# Patient Record
Sex: Female | Born: 1935 | Race: White | Hispanic: No | Smoking: Never smoker
Health system: Southern US, Community
[De-identification: ages and names within clinical notes are randomized; demographics above are authoritative.]

## PROBLEM LIST (undated history)

## (undated) DIAGNOSIS — R0989 Other specified symptoms and signs involving the circulatory and respiratory systems: Secondary | ICD-10-CM

## (undated) DIAGNOSIS — M791 Myalgia, unspecified site: Secondary | ICD-10-CM

## (undated) DIAGNOSIS — M79669 Pain in unspecified lower leg: Secondary | ICD-10-CM

## (undated) DIAGNOSIS — R609 Edema, unspecified: Secondary | ICD-10-CM

## (undated) DIAGNOSIS — M109 Gout, unspecified: Secondary | ICD-10-CM

## (undated) HISTORY — DX: Pain in unspecified lower leg: M79.669

## (undated) HISTORY — DX: Edema, unspecified: R60.9

## (undated) HISTORY — DX: Other specified symptoms and signs involving the circulatory and respiratory systems: R09.89

## (undated) HISTORY — DX: Gout, unspecified: M10.9

## (undated) HISTORY — DX: Myalgia, unspecified site: M79.10

## (undated) HISTORY — PX: OTHER SURGICAL HISTORY: SHX169

---

## 2003-02-24 ENCOUNTER — Ambulatory Visit (HOSPITAL_COMMUNITY): Admission: RE | Admit: 2003-02-24 | Discharge: 2003-02-24 | Payer: Self-pay | Admitting: Neurosurgery

## 2003-03-10 ENCOUNTER — Encounter: Admission: RE | Admit: 2003-03-10 | Discharge: 2003-03-10 | Payer: Self-pay | Admitting: Neurosurgery

## 2003-03-24 ENCOUNTER — Encounter: Admission: RE | Admit: 2003-03-24 | Discharge: 2003-03-24 | Payer: Self-pay | Admitting: Neurosurgery

## 2003-05-25 ENCOUNTER — Inpatient Hospital Stay (HOSPITAL_COMMUNITY): Admission: RE | Admit: 2003-05-25 | Discharge: 2003-05-31 | Payer: Self-pay | Admitting: Neurosurgery

## 2005-07-15 ENCOUNTER — Inpatient Hospital Stay (HOSPITAL_COMMUNITY): Admission: RE | Admit: 2005-07-15 | Discharge: 2005-07-19 | Payer: Self-pay | Admitting: Orthopedic Surgery

## 2006-09-24 IMAGING — US US RENAL
1 series · 14 of 25 positions shown · non-contrast
Comparison: None.

CLINICAL DATA: Decreased urine output. 
RENAL/URINARY TRACT ULTRASOUND:
TECHNIQUE: Complete ultrasound examination of the urinary tract was performed including evaluation of the kidneys, renal collecting systems, and urinary bladder.

[Series 1: unknown · 0.24mm/px · 14 of 38 slices shown]
[im 1/38]
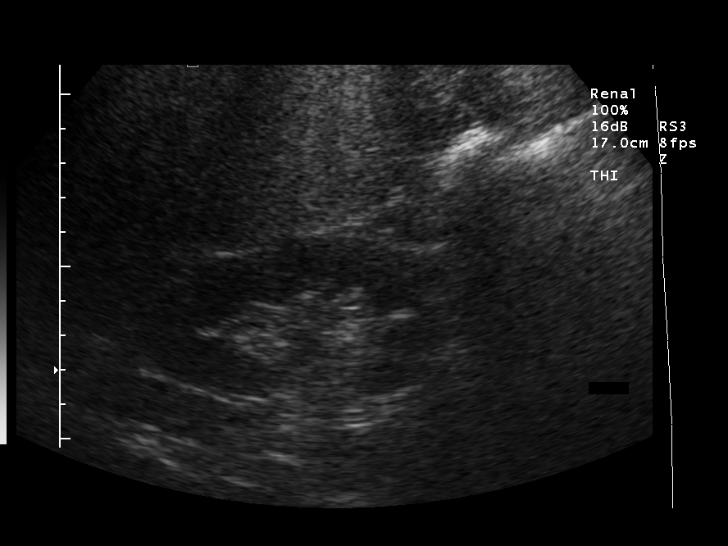
[im 4/38]
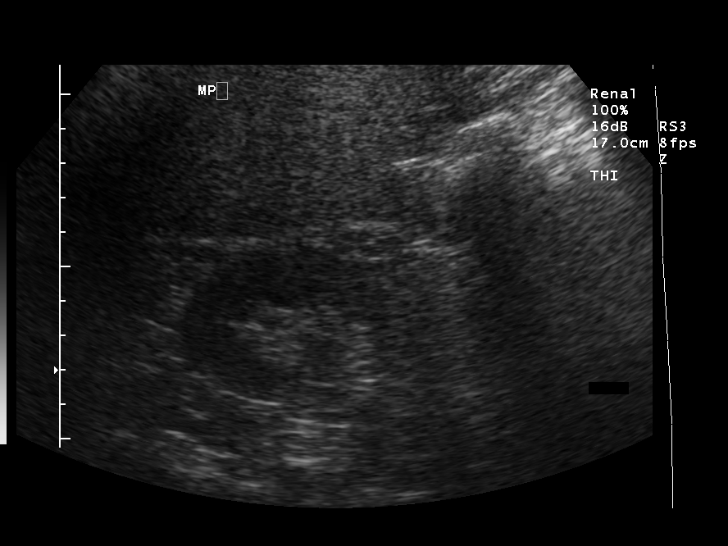
[im 7/38]
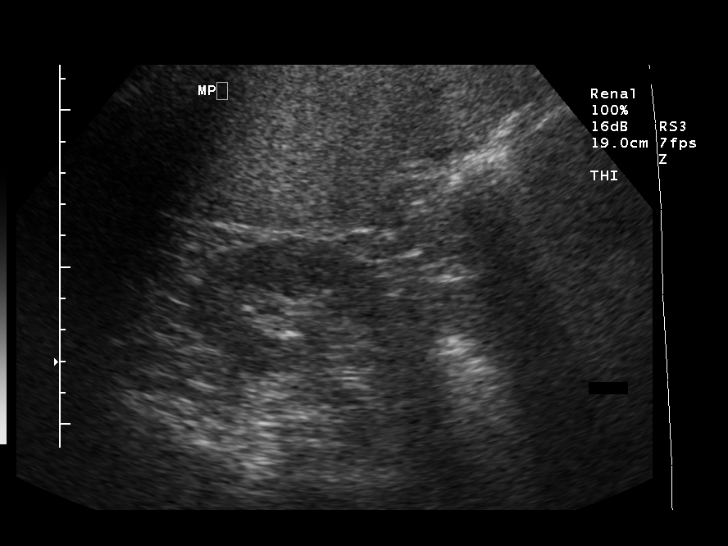
[im 10/38]
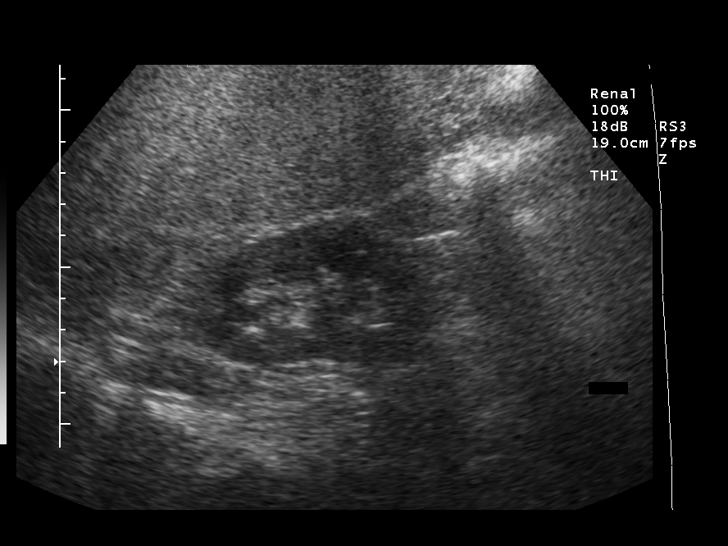
[im 13/38]
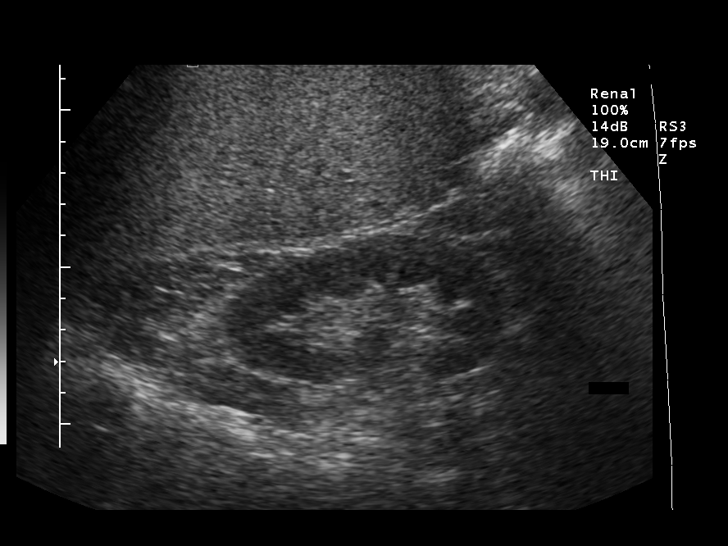
[im 14/38]
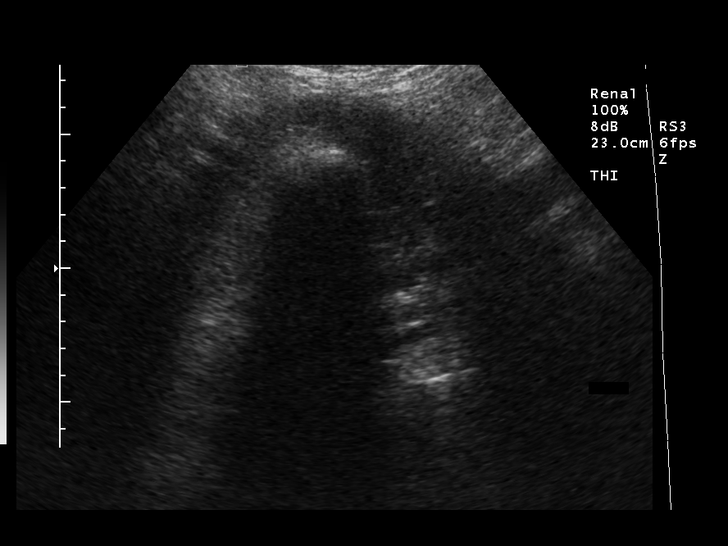
[im 17/38]
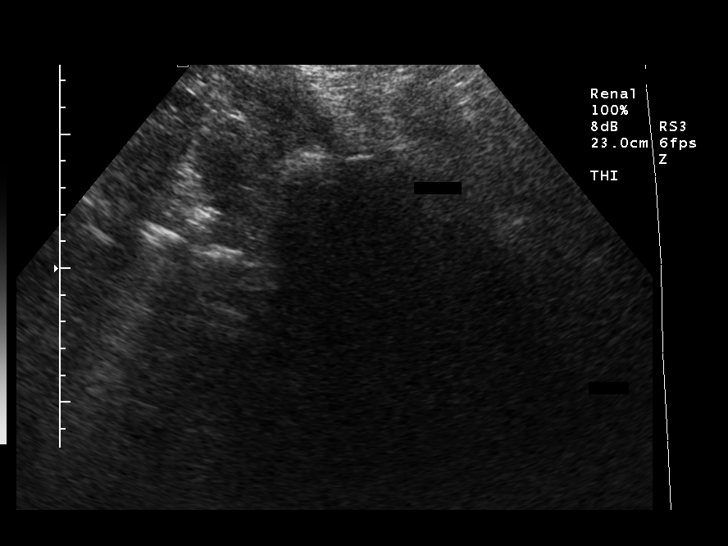
[im 21/38]
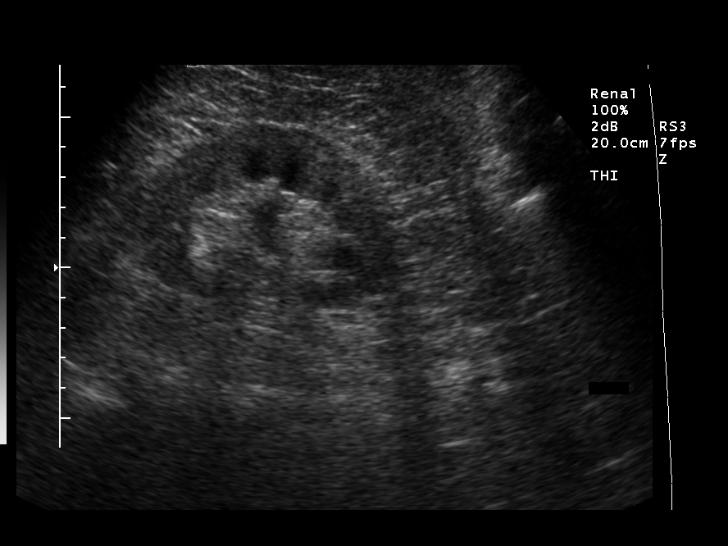
[im 24/38]
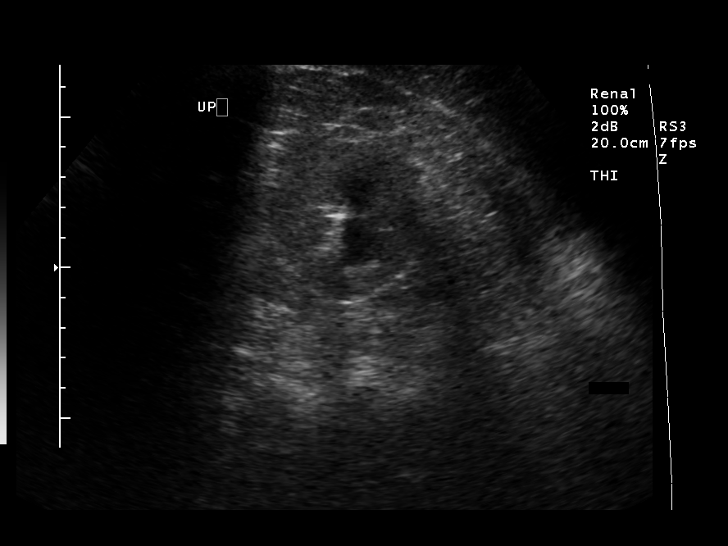
[im 25/38]
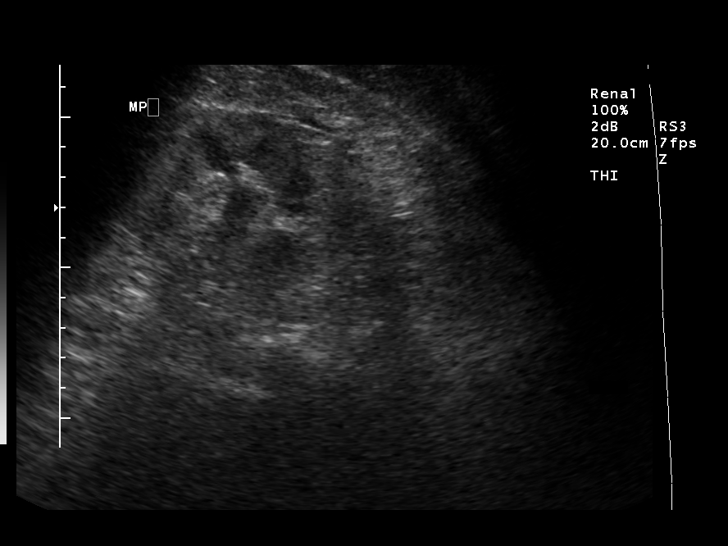
[im 28/38]
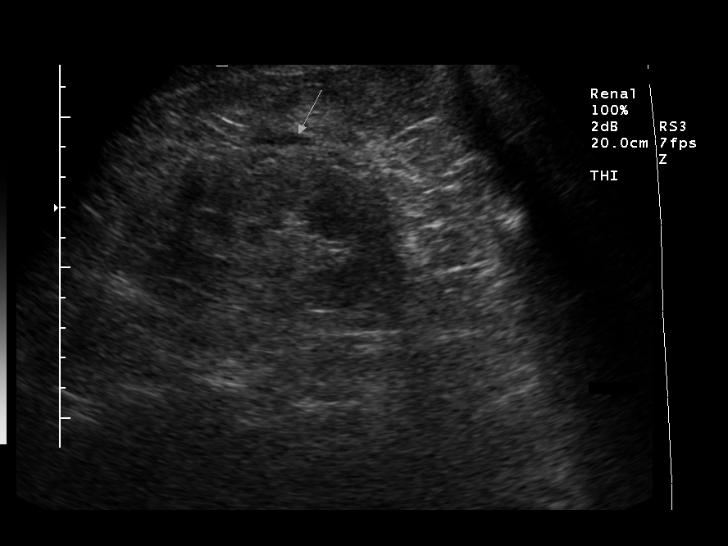
[im 31/38]
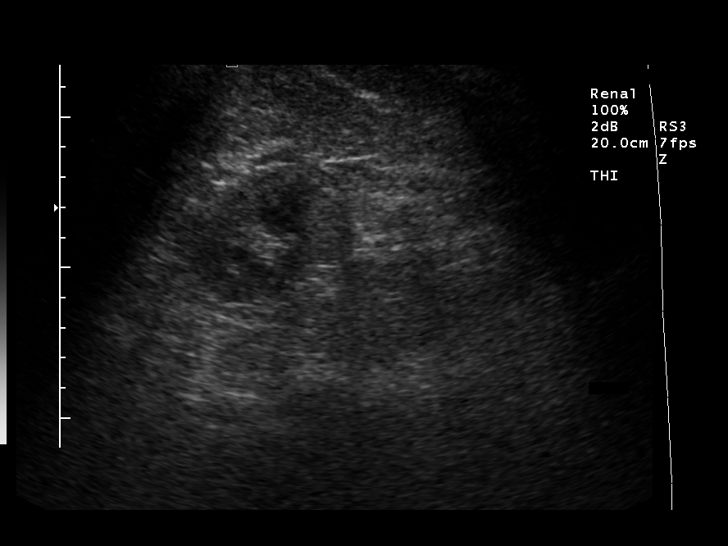
[im 34/38]
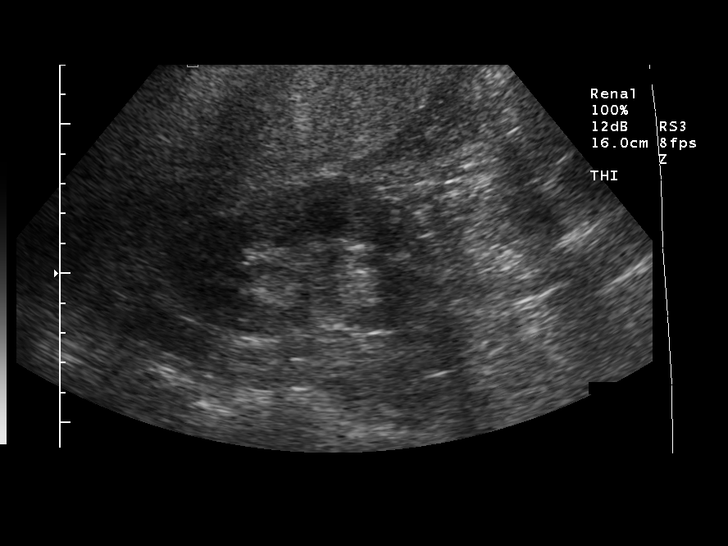
[im 38/38]
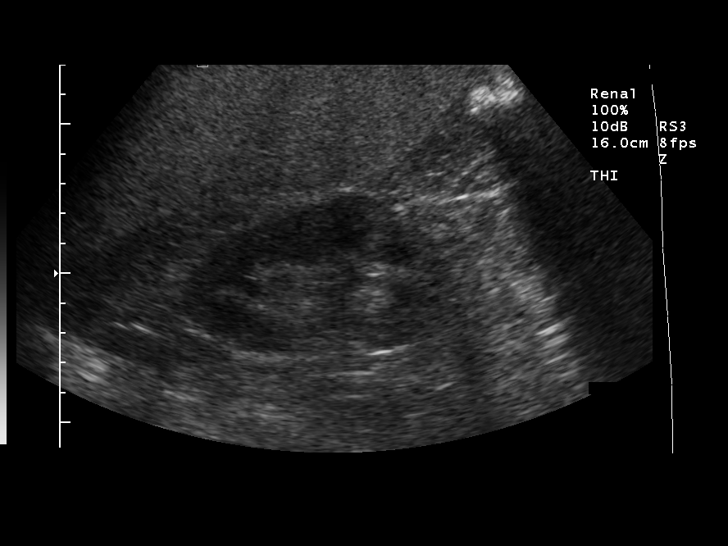

[14 of 25 positions shown; findings below may reference images not displayed]

FINDINGS: There is mild pelvocaliectasis, bilaterally.    The right kidney measures 10.1 and the left kidney measures 10.3 cm.
IMPRESSION: Mild pelvocaliectasis.

## 2008-09-12 ENCOUNTER — Inpatient Hospital Stay (HOSPITAL_COMMUNITY): Admission: RE | Admit: 2008-09-12 | Discharge: 2008-09-14 | Payer: Self-pay | Admitting: Orthopedic Surgery

## 2008-10-11 ENCOUNTER — Encounter: Admission: RE | Admit: 2008-10-11 | Discharge: 2008-11-03 | Payer: Self-pay | Admitting: Orthopedic Surgery

## 2009-11-21 IMAGING — CR DG HIP 1V PORT*R*
1 series · 1 of 1 positions shown · non-contrast
Comparison: None.

Addendum Begins

The incorrect laterality was dictated in both the Findings
paragraph and the Impression paragraph of the report.  This cross-
table lateral exam is of the patient's right hip not the left hip
as stated in the report below.  Otherwise, the report is accurate
as dictated, with no evidence for hardware complications in this
patient immediately status post right total hip replacement.
Addendum Ends
CLINICAL DATA: Postop for left hip replacement.
PORTABLE RIGHT HIP - 1 VIEW

[cross table lat hip]
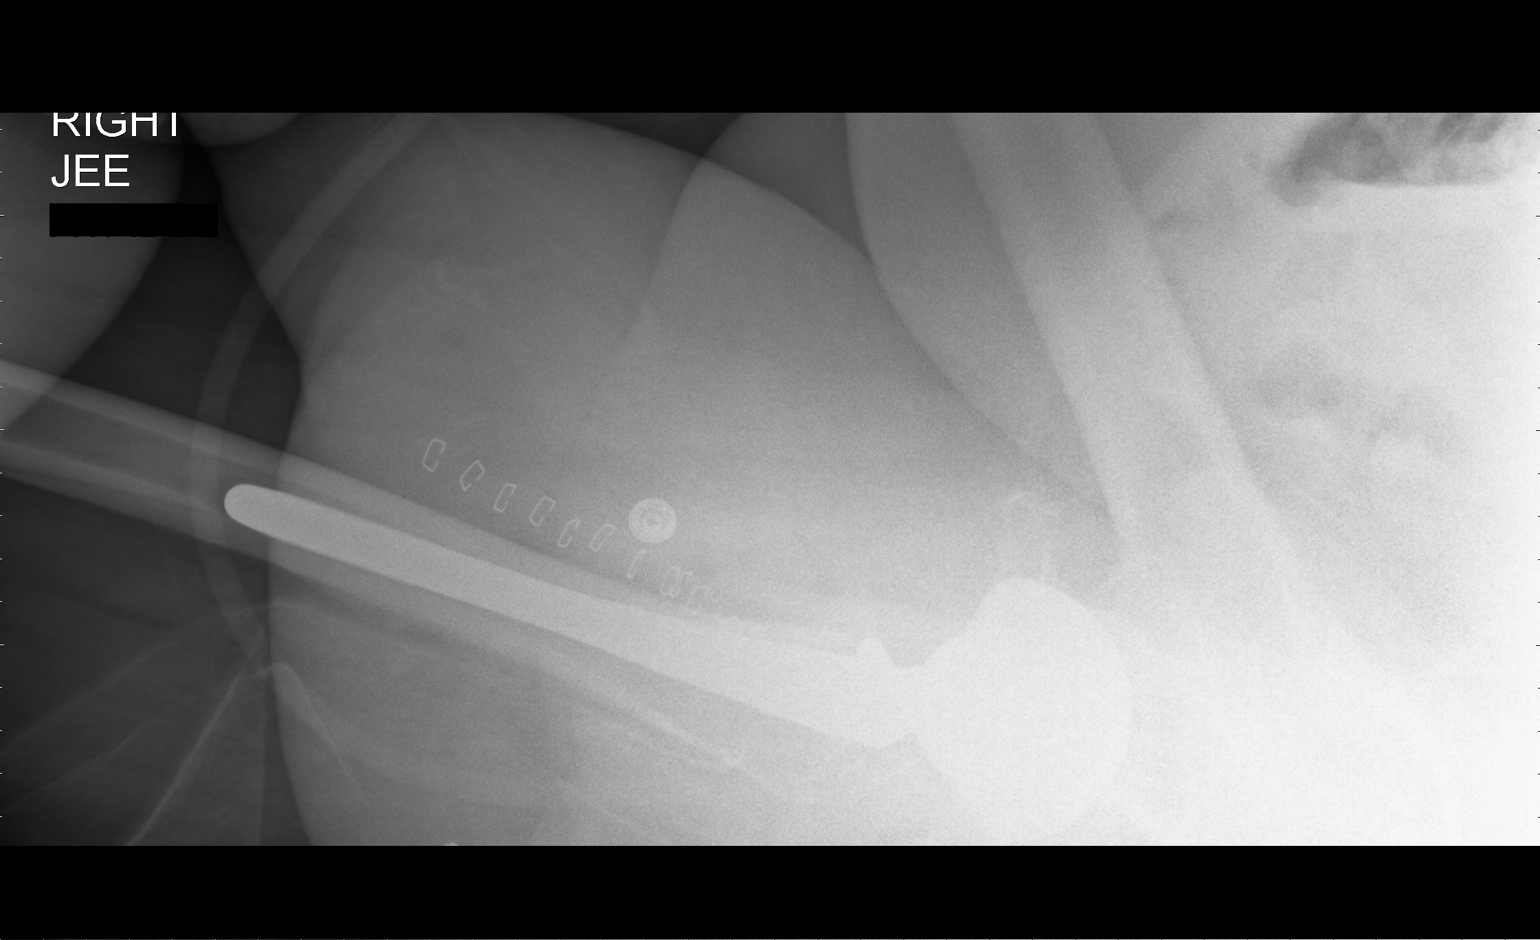

[1 of 1 positions shown; findings below may reference images not displayed]

FINDINGS: 6323 hours.  Cross-table lateral view of the left hip
shows the the patient to be status post a left total hip
replacement.  The femoral component appears located within the
acetabular cup.
IMPRESSION: Status post left total hip replacement.  No evidence for
complicating features.

## 2009-11-21 IMAGING — CR DG PORTABLE PELVIS
2 series · 2 of 2 positions shown · non-contrast
Comparison: None

CLINICAL DATA: Postop right hip arthroplasty for osteoarthritis.

PORTABLE PELVIS

[AP (1 of 2)]
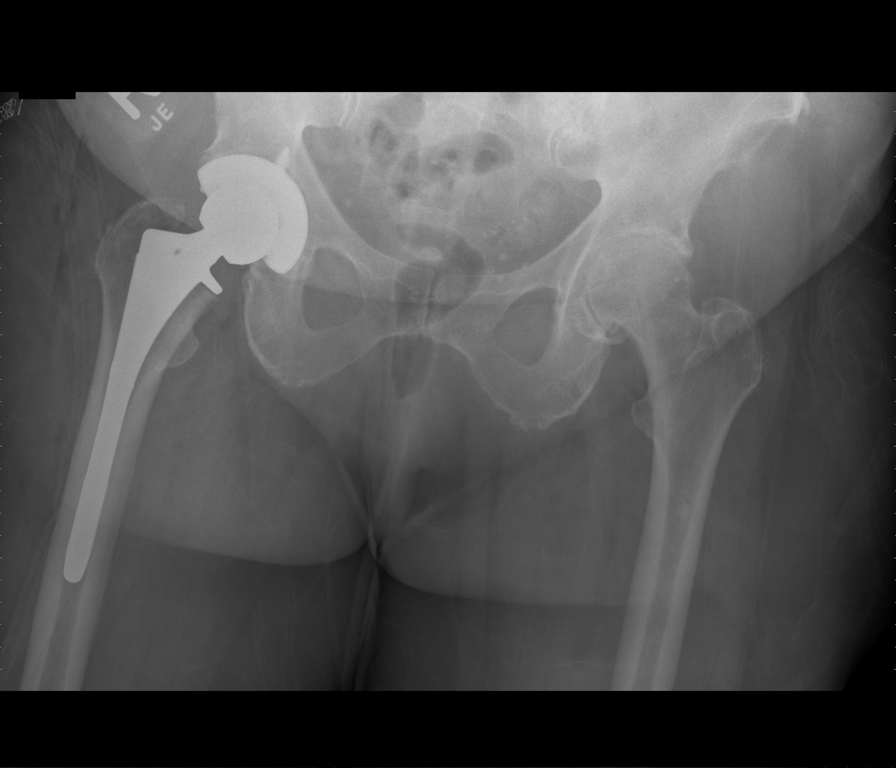

[AP (2 of 2)]
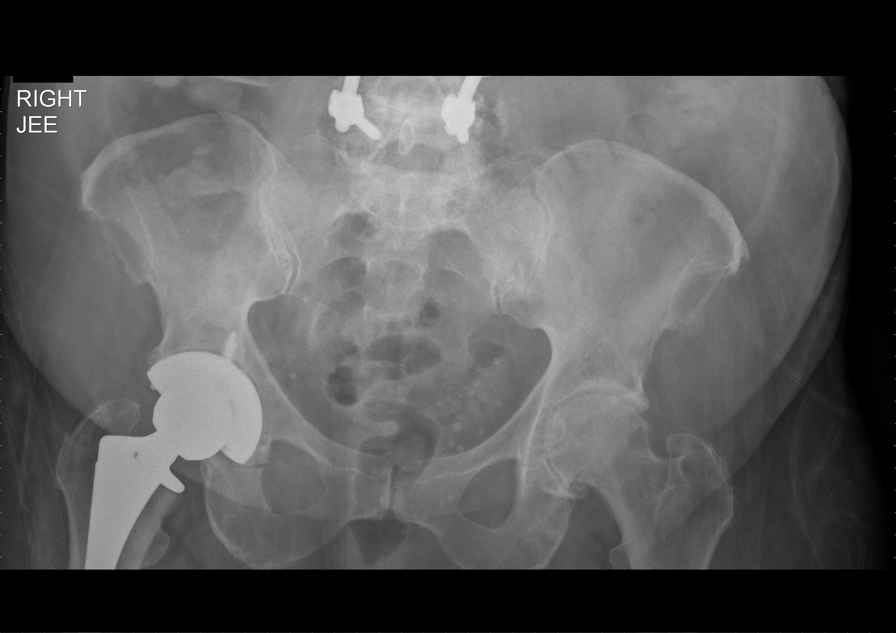

[2 of 2 positions shown; findings below may reference images not displayed]

FINDINGS: Two portable views demonstrate right total hip
replacement with a screw fixed acetabular component.  There is no
evidence of acute fracture or dislocation.  Left hip degenerative
changes and postsurgical changes of the lower lumbar spine are
noted.
IMPRESSION: No evidence of complication related to right total hip
arthroplasty.

## 2010-03-03 ENCOUNTER — Encounter: Payer: Self-pay | Admitting: Neurosurgery

## 2010-05-19 LAB — CBC
HCT: 23.1 % — ABNORMAL LOW (ref 36.0–46.0)
HCT: 26.4 % — ABNORMAL LOW (ref 36.0–46.0)
HCT: 28.6 % — ABNORMAL LOW (ref 36.0–46.0)
Hemoglobin: 8 g/dL — ABNORMAL LOW (ref 12.0–15.0)
Hemoglobin: 8.9 g/dL — ABNORMAL LOW (ref 12.0–15.0)
Hemoglobin: 9.9 g/dL — ABNORMAL LOW (ref 12.0–15.0)
MCHC: 33.9 g/dL (ref 30.0–36.0)
MCHC: 34.5 g/dL (ref 30.0–36.0)
MCHC: 34.7 g/dL (ref 30.0–36.0)
MCV: 88.4 fL (ref 78.0–100.0)
MCV: 88.5 fL (ref 78.0–100.0)
MCV: 89.7 fL (ref 78.0–100.0)
Platelets: 139 10*3/uL — ABNORMAL LOW (ref 150–400)
Platelets: 175 10*3/uL (ref 150–400)
Platelets: 205 10*3/uL (ref 150–400)
RBC: 2.61 MIL/uL — ABNORMAL LOW (ref 3.87–5.11)
RBC: 2.94 MIL/uL — ABNORMAL LOW (ref 3.87–5.11)
RBC: 3.23 MIL/uL — ABNORMAL LOW (ref 3.87–5.11)
RDW: 13.8 % (ref 11.5–15.5)
RDW: 14 % (ref 11.5–15.5)
RDW: 14.7 % (ref 11.5–15.5)
WBC: 11.4 10*3/uL — ABNORMAL HIGH (ref 4.0–10.5)
WBC: 13.1 10*3/uL — ABNORMAL HIGH (ref 4.0–10.5)
WBC: 13.1 10*3/uL — ABNORMAL HIGH (ref 4.0–10.5)

## 2010-05-19 LAB — BASIC METABOLIC PANEL
BUN: 41 mg/dL — ABNORMAL HIGH (ref 6–23)
BUN: 43 mg/dL — ABNORMAL HIGH (ref 6–23)
CO2: 25 mEq/L (ref 19–32)
CO2: 27 mEq/L (ref 19–32)
Calcium: 8.1 mg/dL — ABNORMAL LOW (ref 8.4–10.5)
Calcium: 8.1 mg/dL — ABNORMAL LOW (ref 8.4–10.5)
Chloride: 98 mEq/L (ref 96–112)
Chloride: 99 mEq/L (ref 96–112)
Creatinine, Ser: 2.18 mg/dL — ABNORMAL HIGH (ref 0.4–1.2)
Creatinine, Ser: 2.29 mg/dL — ABNORMAL HIGH (ref 0.4–1.2)
GFR calc Af Amer: 25 mL/min — ABNORMAL LOW (ref >60)
GFR calc Af Amer: 27 mL/min — ABNORMAL LOW (ref >60)
GFR calc non Af Amer: 21 mL/min — ABNORMAL LOW (ref >60)
GFR calc non Af Amer: 22 mL/min — ABNORMAL LOW (ref >60)
Glucose, Bld: 117 mg/dL — ABNORMAL HIGH (ref 70–99)
Glucose, Bld: 119 mg/dL — ABNORMAL HIGH (ref 70–99)
Potassium: 4.1 mEq/L (ref 3.5–5.1)
Potassium: 4.4 mEq/L (ref 3.5–5.1)
Sodium: 135 mEq/L (ref 135–145)
Sodium: 137 mEq/L (ref 135–145)

## 2010-05-19 LAB — URINE CULTURE
Colony Count: NO GROWTH
Culture: NO GROWTH

## 2010-05-19 LAB — URINALYSIS, ROUTINE W REFLEX MICROSCOPIC
Bilirubin Urine: NEGATIVE
Glucose, UA: NEGATIVE mg/dL
Hgb urine dipstick: NEGATIVE
Ketones, ur: NEGATIVE mg/dL
Nitrite: NEGATIVE
Protein, ur: NEGATIVE mg/dL
Specific Gravity, Urine: 1.021 (ref 1.005–1.030)
Urobilinogen, UA: 1 mg/dL (ref 0.0–1.0)
pH: 5.5 (ref 5.0–8.0)

## 2010-05-19 LAB — CROSSMATCH
ABO/RH(D): A POS
Antibody Screen: NEGATIVE

## 2010-05-19 LAB — ABO/RH: ABO/RH(D): A POS

## 2010-05-20 LAB — COMPREHENSIVE METABOLIC PANEL
ALT: 19 U/L (ref 0–35)
Alkaline Phosphatase: 158 U/L — ABNORMAL HIGH (ref 39–117)
BUN: 34 mg/dL — ABNORMAL HIGH (ref 6–23)
CO2: 31 mEq/L (ref 19–32)
Chloride: 102 mEq/L (ref 96–112)
Glucose, Bld: 83 mg/dL (ref 70–99)
Potassium: 5.3 mEq/L — ABNORMAL HIGH (ref 3.5–5.1)
Total Bilirubin: 0.6 mg/dL (ref 0.3–1.2)

## 2010-05-20 LAB — URINE MICROSCOPIC-ADD ON

## 2010-05-20 LAB — CBC
HCT: 33.6 % — ABNORMAL LOW (ref 36.0–46.0)
Hemoglobin: 11.5 g/dL — ABNORMAL LOW (ref 12.0–15.0)
MCHC: 34.3 g/dL (ref 30.0–36.0)
MCV: 89.4 fL (ref 78.0–100.0)
Platelets: 273 10*3/uL (ref 150–400)
RBC: 3.76 MIL/uL — ABNORMAL LOW (ref 3.87–5.11)
RDW: 13.5 % (ref 11.5–15.5)
WBC: 9 10*3/uL (ref 4.0–10.5)

## 2010-05-20 LAB — URINE CULTURE: Colony Count: 35000

## 2010-05-20 LAB — URINALYSIS, ROUTINE W REFLEX MICROSCOPIC
Bilirubin Urine: NEGATIVE
Hgb urine dipstick: NEGATIVE
Ketones, ur: NEGATIVE mg/dL
Nitrite: NEGATIVE
Urobilinogen, UA: 0.2 mg/dL (ref 0.0–1.0)
pH: 6 (ref 5.0–8.0)

## 2010-05-20 LAB — PROTIME-INR: INR: 0.9 (ref 0.00–1.49)

## 2010-05-20 LAB — APTT: aPTT: 28 seconds (ref 24–37)

## 2010-05-20 LAB — DIFFERENTIAL
Basophils Absolute: 0 10*3/uL (ref 0.0–0.1)
Basophils Relative: 1 % (ref 0–1)
Eosinophils Absolute: 0.3 10*3/uL (ref 0.0–0.7)
Neutro Abs: 5.4 10*3/uL (ref 1.7–7.7)
Neutrophils Relative %: 60 % (ref 43–77)

## 2010-05-21 ENCOUNTER — Other Ambulatory Visit: Payer: Self-pay | Admitting: Internal Medicine

## 2010-05-21 DIAGNOSIS — M549 Dorsalgia, unspecified: Secondary | ICD-10-CM

## 2010-05-25 ENCOUNTER — Other Ambulatory Visit: Payer: Self-pay

## 2010-06-26 NOTE — Op Note (Signed)
[name], [name]            ACCOUNT NO.:  [id number]   MEDICAL RECORD NO.:  [id number]          PATIENT TYPE:  INP   LOCATION:  5013                         FACILITY:  [facility]   PHYSICIAN:  Mila Homer. Sherlean Foot, M.D. DATE OF BIRTH:  [birth year]   DATE OF PROCEDURE:  09/12/2008  DATE OF DISCHARGE:                               OPERATIVE REPORT   SURGEON:  Mila Homer. Sherlean Foot, MD   ASSISTANTS:  1. Altamese Cabal, PA-C  2. Skip Mayer, PA-C   ANESTHESIA:  General.   PREOPERATIVE DIAGNOSIS:  Right hip osteoarthritis.   POSTOPERATIVE DIAGNOSIS:  Right hip osteoarthritis.   PROCEDURE:  Right total hip arthroplasty.   INDICATIONS FOR PROCEDURE:  The patient is a 75 year old white female  with failure of conservative measures for osteoarthritis of the right  hip.  Informed consent was obtained.   DESCRIPTION OF PROCEDURE:  The patient was laid supine, administered  general anesthesia, and Foley catheter was placed.  The patient was  placed in left down and right up lateral decubitus position.  Right hip  was prepped and draped in the usual sterile fashion.  Curvilinear  incision was made, centered over the trochanter approximately 10 cm in  length.  Cautery dissection was performed down into the fascia lata, and  Charnley retractor was put in place.  I made an incision in the anterior  one-half of the gluteus medius, vastus lateralis, and gluteus minimus  elevating that all in a single sleeve and tying it with stay sutures.  I  then retracted those with Hohmann retractor and performed an anterior  hip capsulectomy.  At this point, I removed the anterior hip capsule and  used the neck cutting guide to mark out our neck cut.  I made that cut  with a sagittal saw.  I then removed the head and neck segment.  I then  placed a Hohmann retractor anteriorly and posteriorly and towards the  side of the table with my PA.  I then removed the labrum  circumferentially.  I then reamed  sequentially up to 52 mm and put in a  54 mm, 3-hole cup, and placed a single 35-mm screw and this was just  because of the patient's bone was not a great bone stock why had a good  fit when I put one screw in.  I placed a standard 2-mm off set liner  receiving a 32-mm head.  I then went back to the back side of the table  and placed the leg into a sterile pouch anteriorly.  I used a Mueller  retractor, elevated the cut surface of the femoral neck up, and  sequentially reamed, it was very, very tight, I got up to 10 mm,  broached to 10 mm, and trialed with 0 head, and had good stability.  I  removed the trial component and placed a MidCoat porous-coated 10-mm  stem and tamped onto a clean Morse taper, a 0 x 32 mm head.  I then  located the hip, took it through aggressive range of motion, was indeed  stable.  I then irrigated and closed the medius, minimus,  lateralis  sleeve down through drill holes and oversewn with 2-0 #2 Tevdek.  I  closed the fascia lata with running #1 Vicryls.  I closed deep soft  tissue with buried 0 Vicryl, subcuticular 2-0 Vicryl and skin staples.  I dressed with Mepilex dressing.   COMPLICATIONS:  None.   DRAINS:  None.   ESTIMATED BLOOD LOSS:  300 mL.           ______________________________  Mila Homer. Sherlean Foot, M.D.     SDL/MEDQ  D:  09/13/2008  T:  09/14/2008  Job:  [id number]

## 2010-06-26 NOTE — Discharge Summary (Signed)
[name], [name]            ACCOUNT NO.:  [id number]   MEDICAL RECORD NO.:  [id number]          PATIENT TYPE:  INP   LOCATION:  5013                         FACILITY:  [facility]   PHYSICIAN:  Mila Homer. Sherlean Foot, M.D. DATE OF BIRTH:  1935-06-03   DATE OF ADMISSION:  09/12/2008  DATE OF DISCHARGE:  09/14/2008                               DISCHARGE SUMMARY   ADMISSION DIAGNOSIS:  Osteoarthritis of the right hip.   DISCHARGE DIAGNOSES:  1. Osteoarthritis of the right hip.  2. Status post right hip arthroplasty.  3. Acute blood loss anemia status post surgery.   PROCEDURE:  Right total hip arthroplasty.   HISTORY:  This is a 75 year old female complaining of severe constant  pain in the right hip.  Pain is interfering with activities of daily  living.  Conservative treatments failed.  Risks and benefits were  discussed with the patient.  The patient would like to proceed with a  right total knee arthroplasty.   ALLERGIES:  The patient has no known drug allergies.   MEDICATIONS UPON ADMISSION TO THE HOSPITAL:  The patient was taking:  1. Cymbalta 60 mg daily.  2. Atenolol 50 mg daily.  3. Meloxicam 15 daily.  4. Furosemide 20 daily.  5. Omeprazole 20 daily.  6. Pravastatin 80 mg daily.  7. Omega-3 fish oil 1000 daily.  8. Darvocet 100/650.   HOSPITAL COURSE:  This is a 75 year old female admitted on September 12, 2008, after appropriate laboratory studies were obtained preoperatively  as well as Ancef on-call to the operating room.  She was taken to the OR  where she underwent a right THA.  She tolerated the procedure well and  was taken to the PACU in good condition.  The patient was placed on  Dilaudid IV and other p.o. pain medication.  Foley was placed  intraoperatively.   On postop day #1, vital signs stable.  The patient denied chest pain,  shortness of breath, or calf pain.  The patient was started on Lovenox  30 mg at subcu q.12 h. at 8:00 a.m.  Consults with PT, OT,  and care  management were made.  The patient is weightbearing as tolerated.  Incentive spirometry was taught.   On postop day #2, the patient continued to progress with physical  therapy.  Dressing was changed.  Foley was discontinued.  The patient  was continued on home medications.   LABORATORY STUDIES UPON ADMISSION TO THE HOSPITAL:  The patient's white  blood cell count was 9.0, H and H was 11.5 and 36.6, platelets of 273.  Sodium was 142, potassium was 5.3, chloride was 102, CO2 was 31, glucose  was 83, BUN was 34, and creatinine was 1.54.  On discharge from the  hospital, the patient's white blood cell count was 11.4, H and H was 9.9  and 28.6, platelets of 139.  Sodium was 135, potassium was 4.1, chloride  was 99, CO2 was 25, glucose was 119, BUN was 41, creatinine was 2.18.   DISCHARGE MEDICATIONS UPON DISCHARGE FROM THE HOSPITAL:  The patient was  placed on:  1. Lovenox 40 mg  inject once subcu daily, last dose on September 26, 2008.  2. Robaxin 500 mg 1-2 tablets every 6-8 hours as needed for spasm.  3. Vicodin 5/500 one to two tablets every 6 hours as needed for pain.   The patient will follow up with Dr. Sherlean Foot on September 27, 2008, and call  for appointment [phone].  She is discharged in improved condition.     ______________________________  Altamese Cabal, PA-C    ______________________________  Mila Homer. Sherlean Foot, M.D.    MJ/MEDQ  D:  09/29/2008  T:  09/30/2008  Job:  [id number]

## 2010-09-10 ENCOUNTER — Other Ambulatory Visit: Payer: Self-pay | Admitting: Orthopedic Surgery

## 2010-09-10 DIAGNOSIS — M25511 Pain in right shoulder: Secondary | ICD-10-CM

## 2010-09-14 ENCOUNTER — Other Ambulatory Visit: Payer: Self-pay

## 2010-09-21 ENCOUNTER — Ambulatory Visit
Admission: RE | Admit: 2010-09-21 | Discharge: 2010-09-21 | Disposition: A | Discharge status: Home / Self Care | Payer: Medicare Other | Source: Ambulatory Visit | Attending: Orthopedic Surgery | Admitting: Orthopedic Surgery

## 2010-09-21 DIAGNOSIS — M25511 Pain in right shoulder: Secondary | ICD-10-CM

## 2013-03-09 ENCOUNTER — Ambulatory Visit (INDEPENDENT_AMBULATORY_CARE_PROVIDER_SITE_OTHER): Payer: Medicare Other

## 2013-03-09 ENCOUNTER — Encounter: Payer: Self-pay | Admitting: Podiatrist

## 2013-03-09 ENCOUNTER — Ambulatory Visit (INDEPENDENT_AMBULATORY_CARE_PROVIDER_SITE_OTHER): Payer: Medicare Other | Admitting: Podiatrist

## 2013-03-09 VITALS — BP 150/55 | HR 80 | Resp 16

## 2013-03-09 DIAGNOSIS — M79609 Pain in unspecified limb: Secondary | ICD-10-CM

## 2013-03-09 DIAGNOSIS — M7661 Achilles tendinitis, right leg: Secondary | ICD-10-CM

## 2013-03-09 DIAGNOSIS — M7660 Achilles tendinitis, unspecified leg: Secondary | ICD-10-CM

## 2013-03-09 NOTE — Patient Instructions (Signed)

## 2013-03-09 NOTE — Progress Notes (Signed)
   Subjective:    Patient ID: [name], female    DOB: 09/22/1935, 78 y.o.   MRN: [id number]  HPI patient presents today for pain in the posterior aspect of her right heel. She states "my right heel is sore and has a bump on the back and not as sore as it was and been going on for about 1 month and burns and throbs and hurts am and hurts with shoes" she relates she can not wear open back shoes and it's too painful on her heel. She is a friend of Stark Braylsie Huffman who referred her to me for her foot pain.    Review of Systems  Constitutional: Positive for unexpected weight change.  Respiratory: Positive for shortness of breath and wheezing.   Cardiovascular: Positive for leg swelling.  Musculoskeletal: Positive for back pain and gait problem.       JOINT AND MUSCLE PAIN  Neurological: Positive for tremors.  Hematological: Bruises/bleeds easily.  Psychiatric/Behavioral: Positive for behavioral problems.  All other systems reviewed and are negative.       Objective:   Physical Exam Neurovascular status is intact to the right foot. Pedal pulses are palpable and neurological sensation is intact bilateral. She does have swelling on bilateral feet ankles and lower legs which is her baseline. She has pain along the posterior aspect of the right heel at the insertion of the Achilles tendon on the posterior calcaneus. Swelling and pain noted on the most medial aspect of the heel at and near the ankle joint. Dermatological examination is within normal limits bilateral and rectus foot type is seen bilateral.   Assessment & Plan:  Achilles tendinitis with heel spur syndrome right, bursitis right  Plan: Recommended an injection along the area of discomfort and the patient agreed. Discussed the risk of rupture. A sterile prep was performed an injection of dexamethasone and Marcaine mixture was infiltrated along the most medial aspect of the posterior right heel being careful to stay away from the  insertion point of the tendon itself. The patient tolerated this well. I put her in a ankle brace and instructed her on decrease in activity for 3 weeks on his heel. She'll be seen back for recheck and if any problems arise she was instructed to call. Also we discussed a topical anti-inflammatory cream and she's interested she will call me know.

## 2018-01-23 DIAGNOSIS — N184 Chronic kidney disease, stage 4 (severe): Secondary | ICD-10-CM

## 2018-01-23 DIAGNOSIS — I129 Hypertensive chronic kidney disease with stage 1 through stage 4 chronic kidney disease, or unspecified chronic kidney disease: Secondary | ICD-10-CM

## 2018-01-23 DIAGNOSIS — F0390 Unspecified dementia without behavioral disturbance: Secondary | ICD-10-CM

## 2018-01-23 DIAGNOSIS — F05 Delirium due to known physiological condition: Secondary | ICD-10-CM

## 2018-01-23 DIAGNOSIS — R4182 Altered mental status, unspecified: Secondary | ICD-10-CM

## 2018-01-26 DIAGNOSIS — R031 Nonspecific low blood-pressure reading: Secondary | ICD-10-CM

## 2018-12-13 DEATH — deceased
